# Patient Record
Sex: Male | Born: 1995 | Race: White | Hispanic: No | Marital: Single | Smoking: Never smoker
Health system: Southern US, Community
[De-identification: ages and names within clinical notes are randomized; demographics above are authoritative.]

## PROBLEM LIST (undated history)

## (undated) DIAGNOSIS — J45909 Unspecified asthma, uncomplicated: Secondary | ICD-10-CM

## (undated) HISTORY — PX: CHOLECYSTECTOMY: SHX55

---
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
## 2016-10-28 IMAGING — CR DG LUMBAR SPINE COMPLETE 4+V
5 series · 5 of 5 positions shown · non-contrast
Comparison: None.

CLINICAL DATA: A solid yesterday.  Back pain.

EXAM:
LUMBAR SPINE - COMPLETE 4+ VIEW

[t l-spine a.p.]
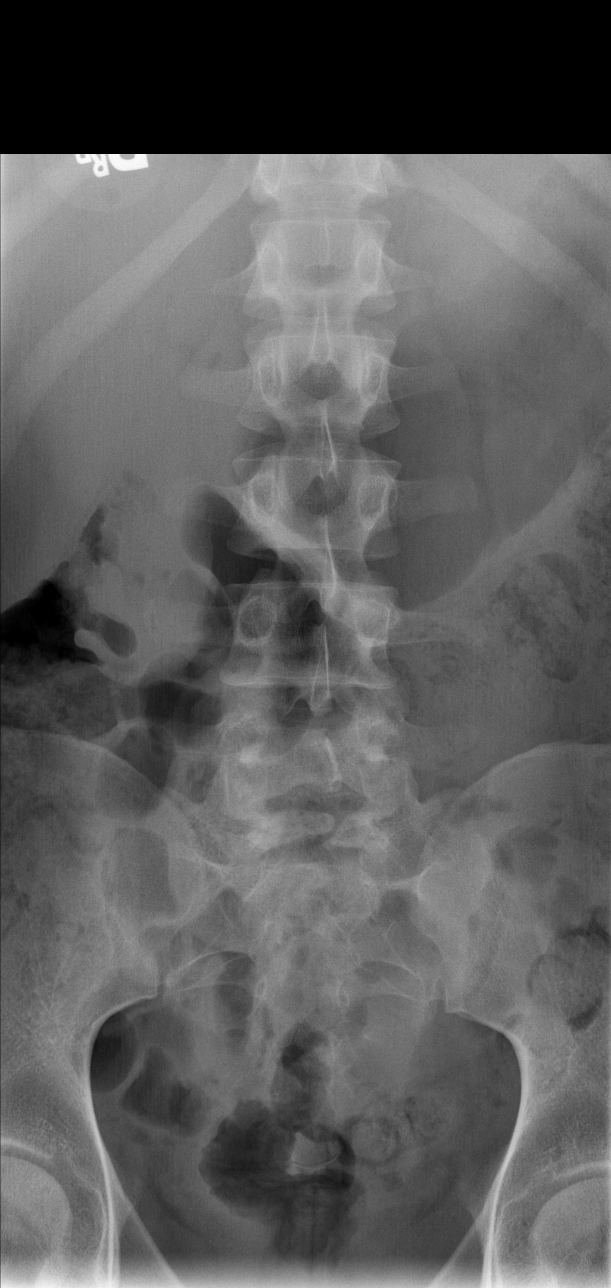

[t l-spine oblique exposure (1 of 2)]
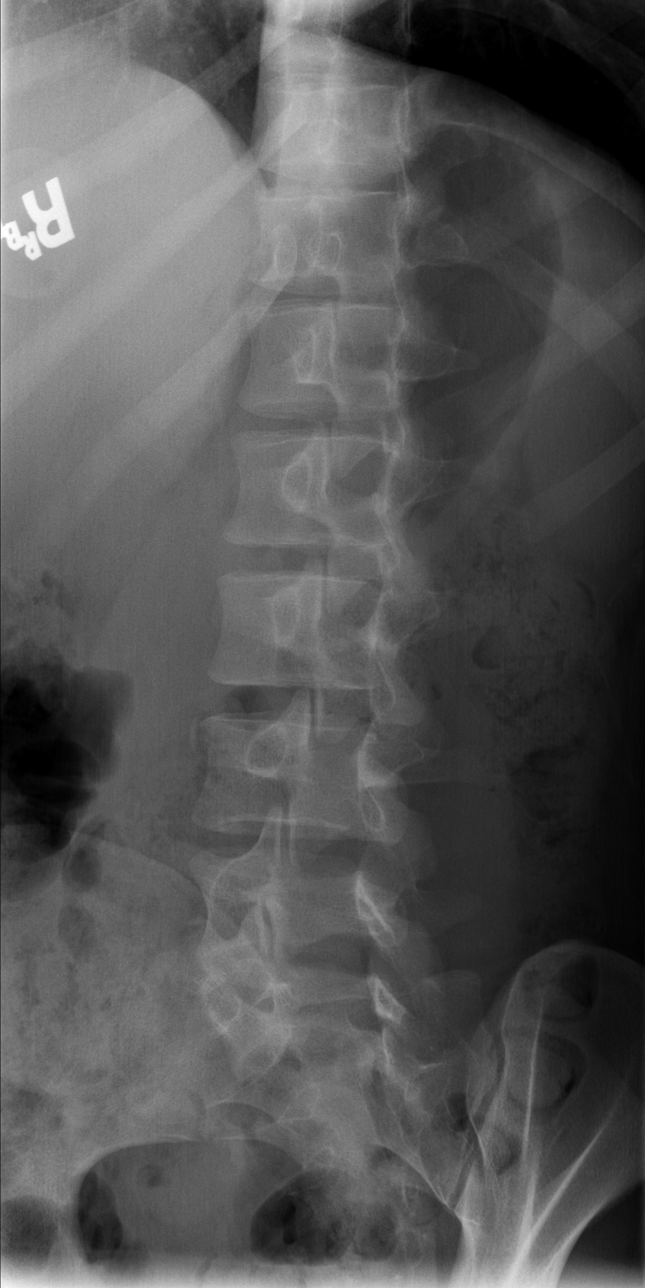

[t l-spine oblique exposure (2 of 2)]
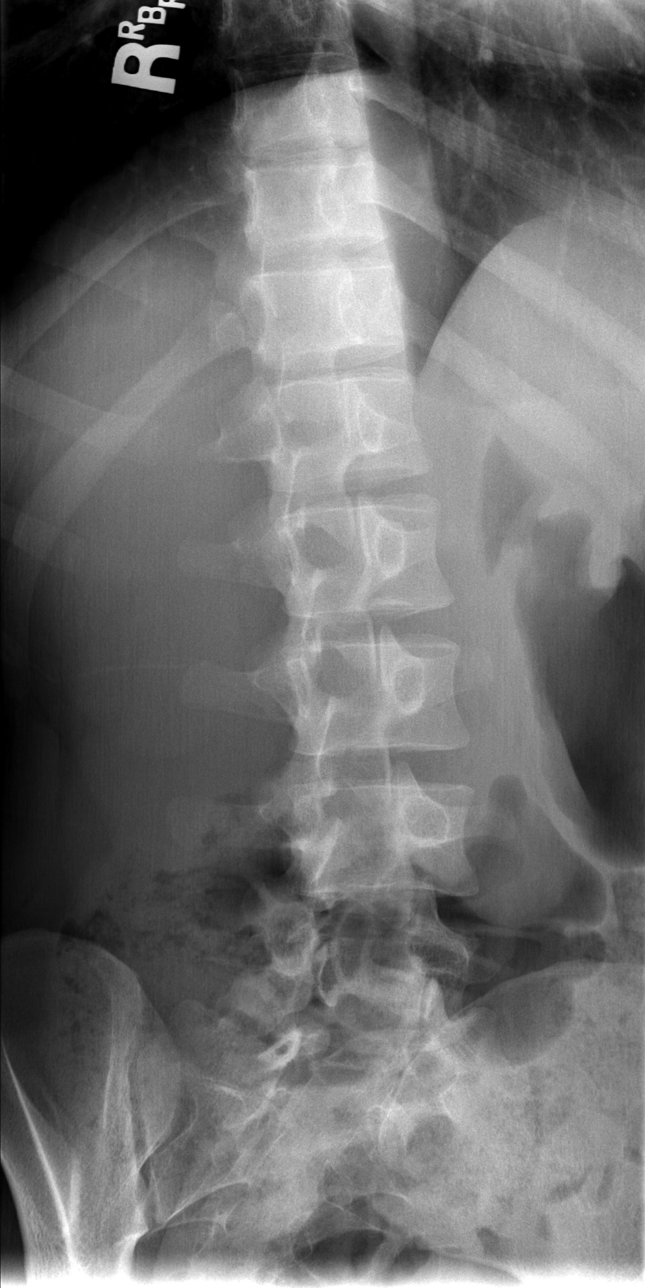

[t l-spine lat]
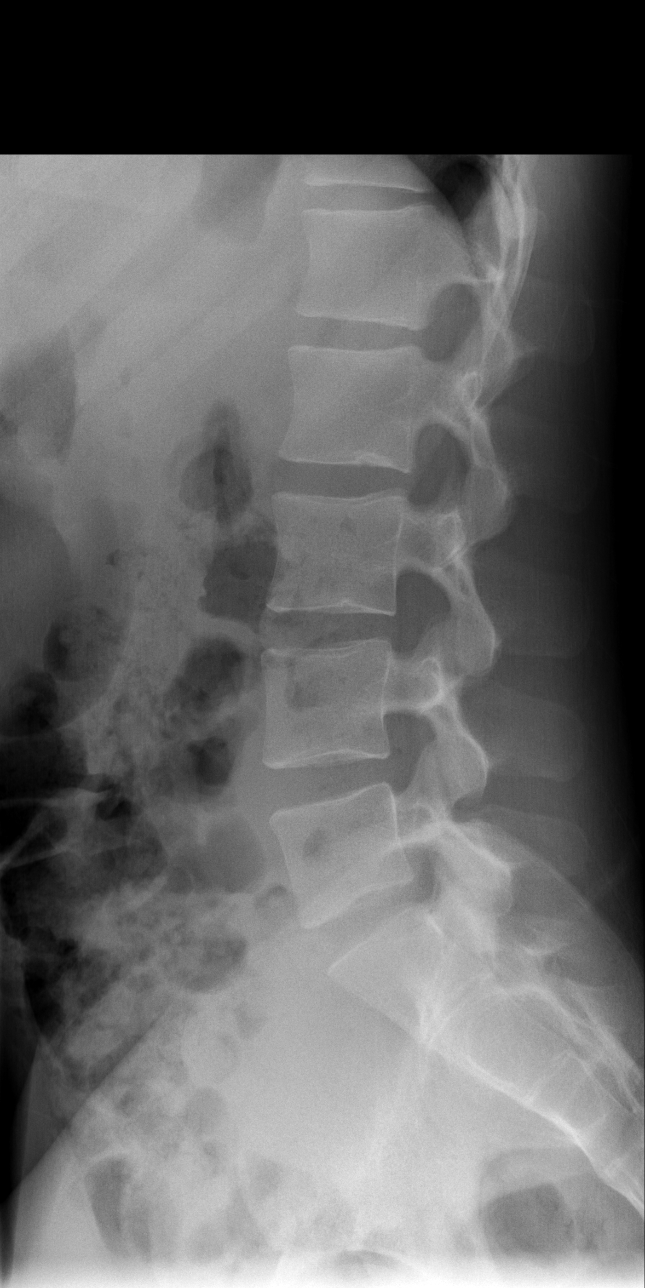

[t l-spine l5-s1 spot]
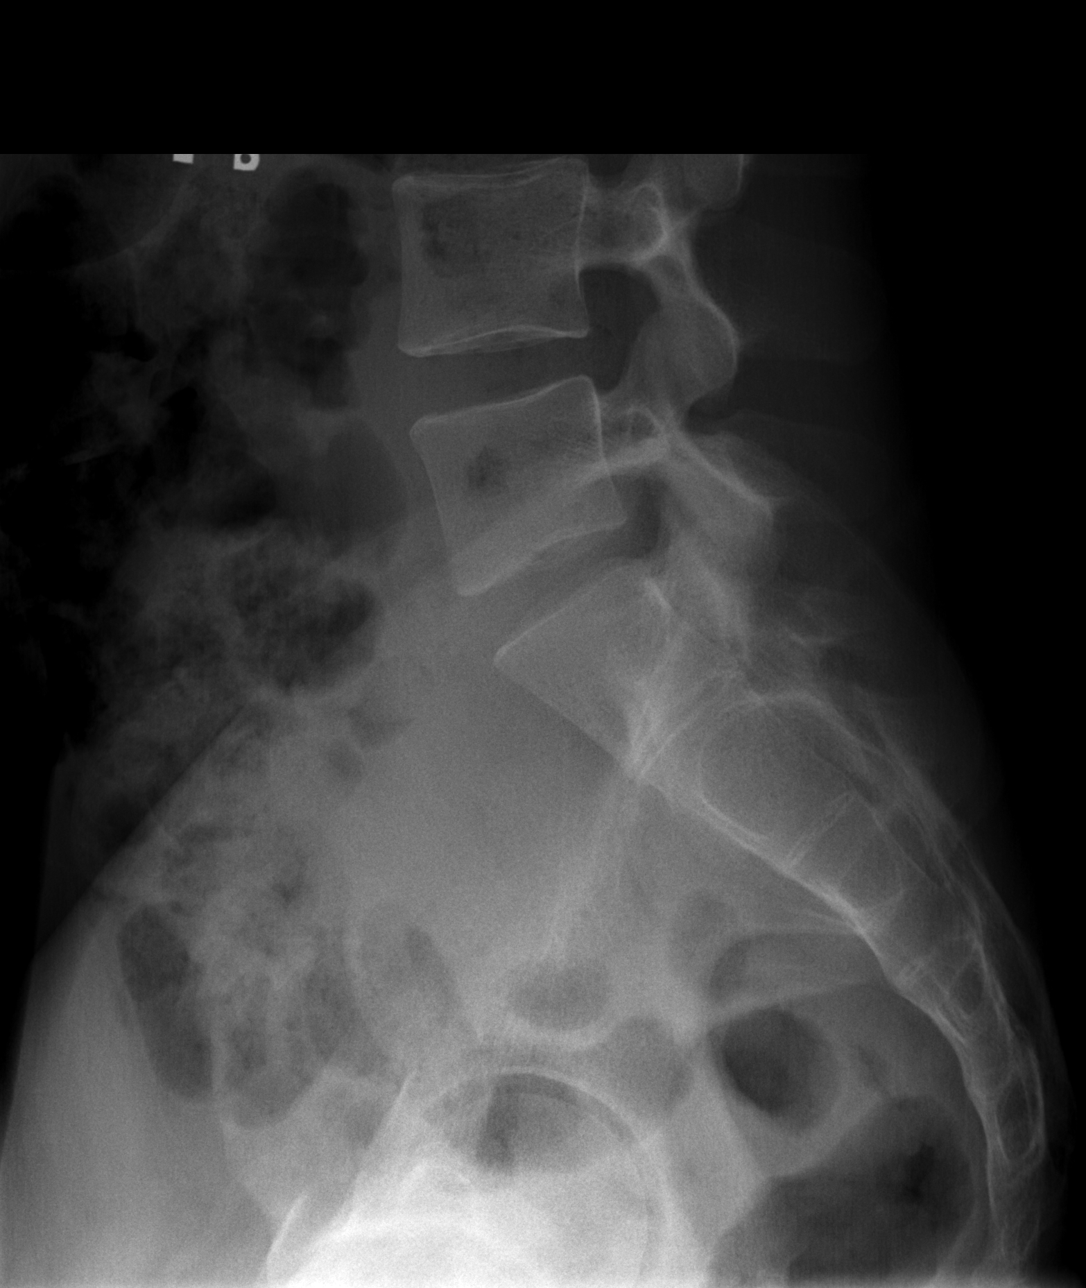

[5 of 5 positions shown; findings below may reference images not displayed]

FINDINGS: Five non rib-bearing lumbar type vertebral bodies are present.
Vertebral body heights alignment are maintained. There is incomplete
closure of the posterior elements at S1, essentially normal variant.

Vertebral body heights alignment are maintained. No acute bone or
soft tissue abnormality is present.
IMPRESSION: Negative lumbar spine radiographs.

## 2022-06-22 ENCOUNTER — Encounter (HOSPITAL_BASED_OUTPATIENT_CLINIC_OR_DEPARTMENT_OTHER): Payer: Self-pay | Admitting: Emergency Medicine

## 2022-06-22 ENCOUNTER — Emergency Department (HOSPITAL_BASED_OUTPATIENT_CLINIC_OR_DEPARTMENT_OTHER): Payer: Self-pay

## 2022-06-22 ENCOUNTER — Emergency Department (HOSPITAL_BASED_OUTPATIENT_CLINIC_OR_DEPARTMENT_OTHER)
Admission: EM | Admit: 2022-06-22 | Discharge: 2022-06-22 | Disposition: A | Discharge status: Home / Self Care | Payer: Self-pay | Attending: Emergency Medicine | Admitting: Emergency Medicine

## 2022-06-22 ENCOUNTER — Other Ambulatory Visit (HOSPITAL_BASED_OUTPATIENT_CLINIC_OR_DEPARTMENT_OTHER): Payer: Self-pay

## 2022-06-22 ENCOUNTER — Other Ambulatory Visit: Payer: Self-pay

## 2022-06-22 DIAGNOSIS — J45909 Unspecified asthma, uncomplicated: Secondary | ICD-10-CM | POA: Insufficient documentation

## 2022-06-22 DIAGNOSIS — D72829 Elevated white blood cell count, unspecified: Secondary | ICD-10-CM | POA: Insufficient documentation

## 2022-06-22 DIAGNOSIS — Z1152 Encounter for screening for COVID-19: Secondary | ICD-10-CM | POA: Insufficient documentation

## 2022-06-22 DIAGNOSIS — J029 Acute pharyngitis, unspecified: Secondary | ICD-10-CM | POA: Insufficient documentation

## 2022-06-22 LAB — BASIC METABOLIC PANEL
Anion gap: 8 (ref 5–15)
BUN: 7 mg/dL (ref 6–20)
CO2: 25 mmol/L (ref 22–32)
Calcium: 9.1 mg/dL (ref 8.9–10.3)
Chloride: 104 mmol/L (ref 98–111)
Creatinine, Ser: 0.93 mg/dL (ref 0.61–1.24)
GFR, Estimated: >60 mL/min (ref >60)
Glucose, Bld: 104 mg/dL — ABNORMAL HIGH (ref 70–99)
Potassium: 3.7 mmol/L (ref 3.5–5.1)
Sodium: 137 mmol/L (ref 135–145)

## 2022-06-22 LAB — CBC WITH DIFFERENTIAL/PLATELET
Abs Immature Granulocytes: 0.04 10*3/uL (ref 0.00–0.07)
Basophils Absolute: 0 10*3/uL (ref 0.0–0.1)
Basophils Relative: 0 %
Eosinophils Absolute: 0.5 10*3/uL (ref 0.0–0.5)
Eosinophils Relative: 5 %
HCT: 42.7 % (ref 39.0–52.0)
Hemoglobin: 14.2 g/dL (ref 13.0–17.0)
Immature Granulocytes: 0 %
Lymphocytes Relative: 15 %
Lymphs Abs: 1.6 10*3/uL (ref 0.7–4.0)
MCH: 27.7 pg (ref 26.0–34.0)
MCHC: 33.3 g/dL (ref 30.0–36.0)
MCV: 83.4 fL (ref 80.0–100.0)
Monocytes Absolute: 0.8 10*3/uL (ref 0.1–1.0)
Monocytes Relative: 7 %
Neutro Abs: 7.7 10*3/uL (ref 1.7–7.7)
Neutrophils Relative %: 73 %
Platelets: 301 10*3/uL (ref 150–400)
RBC: 5.12 MIL/uL (ref 4.22–5.81)
RDW: 13 % (ref 11.5–15.5)
WBC: 10.7 10*3/uL — ABNORMAL HIGH (ref 4.0–10.5)
nRBC: 0 % (ref 0.0–0.2)

## 2022-06-22 LAB — RESP PANEL BY RT-PCR (RSV, FLU A&B, COVID)  RVPGX2
Influenza A by PCR: NEGATIVE
Influenza B by PCR: NEGATIVE
Resp Syncytial Virus by PCR: NEGATIVE
SARS Coronavirus 2 by RT PCR: NEGATIVE

## 2022-06-22 LAB — GROUP A STREP BY PCR: Group A Strep by PCR: NOT DETECTED

## 2022-06-22 MED ORDER — DEXAMETHASONE SODIUM PHOSPHATE 10 MG/ML IJ SOLN
10.0000 mg | Freq: Once | INTRAMUSCULAR | Status: AC
  Administered 2022-06-22: 10 mg
  Filled 2022-06-22: qty 1

## 2022-06-22 MED ORDER — IOHEXOL 300 MG/ML  SOLN
100.0000 mL | Freq: Once | INTRAMUSCULAR | Status: AC | PRN
  Administered 2022-06-22: 75 mL via INTRAVENOUS

## 2022-06-22 MED ORDER — PREDNISONE 50 MG PO TABS: ORAL_TABLET | ORAL | 0 refills | Status: AC

## 2022-06-22 MED ORDER — SODIUM CHLORIDE 0.9 % IV BOLUS
1000.0000 mL | Freq: Once | INTRAVENOUS | Status: AC
  Administered 2022-06-22: 1000 mL via INTRAVENOUS

## 2022-06-22 MED ORDER — KETOROLAC TROMETHAMINE 15 MG/ML IJ SOLN
15.0000 mg | Freq: Once | INTRAMUSCULAR | Status: AC
  Administered 2022-06-22: 15 mg via INTRAVENOUS
  Filled 2022-06-22: qty 1

## 2022-06-22 NOTE — ED Provider Notes (Signed)
[place] EMERGENCY DEPARTMENT AT [facility] Provider Note  CSN: [id number] Arrival date & time: 06/22/22 0304  Chief Complaint(s) Sore Throat  HPI Micheal Porter is a 27 y.o. male with past medical history as below, significant for asthma, cholecystectomy who presents to the ED with complaint of sore throat.  He reports pain to his throat over the past 3 days.  No significant relief with OTC analgesics and OTC cough medications.  Having difficulty swallowing and speaking secondary to discomfort to his throat.  Feels like his throat is swollen and has been worsening.  No fevers or chills over last 24 hours.  No nausea or vomiting.  No recent dental procedures.  Having difficulty swallowing his own secretions over last few hours leading up to visit to ED.   Past Medical History Past Medical History:  Diagnosis Date   Asthma    Patient Active Problem List   Diagnosis Date Noted   Left knee injury 01/24/2014   Home Medication(s) Prior to Admission medications   Medication Sig Start Date End Date Taking? Authorizing Provider  predniSONE (DELTASONE) 50 MG tablet 1 p.o. daily x 4 06/22/22  Yes Lorre Nick, MD  albuterol (PROVENTIL HFA;VENTOLIN HFA) 108 (90 Base) MCG/ACT inhaler Inhale 1-2 puffs into the lungs every 6 (six) hours as needed for wheezing or shortness of breath.    [provider]  Cetirizine HCl (ZYRTEC PO) Take by mouth.    [provider]  ibuprofen (ADVIL,MOTRIN) 800 MG tablet Take 1 tablet (800 mg total) by mouth every 8 (eight) hours as needed. 05/30/15   Selina Cooley, MD  permethrin (ELIMITE) 5 % cream Apply thin layer from neck to soles of feet and every crevice in between. Leave on 8-14 hours then wash off in bath or shower. May repeat in 1 week if needed/not improving 11/29/15   Mesner, Barbara Cower, MD                                                                                                                                    Past Surgical  History Past Surgical History:  Procedure Laterality Date   CHOLECYSTECTOMY     Family History History reviewed. No pertinent family history.  Social History Social History   Tobacco Use   Smoking status: Never   Smokeless tobacco: Never  Substance Use Topics   Alcohol use: No    Alcohol/week: 0.0 standard drinks of alcohol   Drug use: No   Allergies Zithromax [azithromycin]  Review of Systems Review of Systems  Constitutional:  Negative for chills and fever.  HENT:  Positive for sore throat, trouble swallowing and voice change. Negative for facial swelling.   Eyes:  Negative for photophobia and visual disturbance.  Respiratory:  Negative for cough and shortness of breath.   Cardiovascular:  Negative for chest pain and palpitations.  Gastrointestinal:  Negative for abdominal pain, nausea and vomiting.  Endocrine: Negative  for polydipsia and polyuria.  Genitourinary:  Negative for difficulty urinating and hematuria.  Musculoskeletal:  Negative for gait problem and joint swelling.  Skin:  Negative for pallor and rash.  Neurological:  Negative for syncope and headaches.  Psychiatric/Behavioral:  Negative for agitation and confusion.     Physical Exam Vital Signs  I have reviewed the triage vital signs BP 130/80   Pulse 93   Temp 98 F (36.7 C) (Oral)   Resp 18   SpO2 98%  Physical Exam Vitals and nursing note reviewed.  Constitutional:      General: He is not in acute distress.    Appearance: Normal appearance. He is well-developed. He is not ill-appearing.  HENT:     Head: Normocephalic and atraumatic. No right periorbital erythema or left periorbital erythema.     Jaw: There is normal jaw occlusion. No trismus.     Right Ear: External ear normal.     Left Ear: External ear normal.     Mouth/Throat:     Mouth: Mucous membranes are moist.     Dentition: Normal dentition.     Pharynx: Uvula midline. Pharyngeal swelling and posterior oropharyngeal erythema  present.     Comments: Tonsils edematous  No drooling, stridor or trismus Dysphonia noted Infra lingual space is soft Eyes:     General: No scleral icterus.    Extraocular Movements: Extraocular movements intact.  Cardiovascular:     Rate and Rhythm: Normal rate and regular rhythm.     Pulses: Normal pulses.     Heart sounds: Normal heart sounds.  Pulmonary:     Effort: Pulmonary effort is normal. No respiratory distress.     Breath sounds: Normal breath sounds.  Abdominal:     General: Abdomen is flat.     Palpations: Abdomen is soft.     Tenderness: There is no abdominal tenderness.  Musculoskeletal:        General: Normal range of motion.     Cervical back: No rigidity.     Right lower leg: No edema.     Left lower leg: No edema.  Skin:    General: Skin is warm and dry.     Capillary Refill: Capillary refill takes less than 2 seconds.  Neurological:     Mental Status: He is alert and oriented to person, place, and time.     GCS: GCS eye subscore is 4. GCS verbal subscore is 5. GCS motor subscore is 6.  Psychiatric:        Mood and Affect: Mood normal.        Behavior: Behavior normal.     ED Results and Treatments Labs (all labs ordered are listed, but only abnormal results are displayed) Labs Reviewed  CBC WITH DIFFERENTIAL/PLATELET - Abnormal; Notable for the following components:      Result Value   WBC 10.7 (*)    All other components within normal limits  BASIC METABOLIC PANEL - Abnormal; Notable for the following components:   Glucose, Bld 104 (*)    All other components within normal limits  GROUP A STREP BY PCR  RESP PANEL BY RT-PCR (RSV, FLU A&B, COVID)  RVPGX2  Radiology CT Soft Tissue Neck W Contrast  Result Date: 06/22/2022 CLINICAL DATA:  27 year old male with sore throat for 3 days, feels like throat is closing now. EXAM: CT  NECK WITH CONTRAST TECHNIQUE: Multidetector CT imaging of the neck was performed using the standard protocol following the bolus administration of intravenous contrast. RADIATION DOSE REDUCTION: This exam was performed according to the departmental dose-optimization program which includes automated exposure control, adjustment of the mA and/or kV according to patient size and/or use of iterative reconstruction technique. CONTRAST:  75mL OMNIPAQUE IOHEXOL 300 MG/ML  SOLN COMPARISON:  None Available. FINDINGS: Pharynx and larynx: Gas distended hypopharynx. Mild generalized laryngeal mucosal space soft tissue thickening, including mild thickening of the epiglottis (series 2, image 50 and sagittal image 52) such as with laryngitis. No evidence of acute epiglottitis. Moderate to severe bilateral tonsillar hypertrophy, with moderate tonsillar hyperenhancement. The lingual tonsil is least affected. The adenoids are affected. And there is also soft tissue swelling of the soft palate and uvula (series 2, image 35). Retained secretions in the nasopharynx and nasal cavity. But no tonsillar suppuration. Parapharyngeal spaces remain normal. Mild retropharyngeal space edema. No retropharyngeal fluid collection. Salivary glands: Negative submandibular glands, submandibular spaces. Negative sublingual space. Granular heterogeneity of the parotid glands, appears to be chronic. No sialolithiasis or parotid duct enlargement. Thyroid: Negative. Lymph nodes: Bilateral level 1 and level 2 reactive appearing cervical lymphadenopathy. Individual enlarged solid nodes up to 17-18 mm short axis. Level 3 nodes less affected bilaterally. No cystic or necrotic nodes. Levels 4 and 5 relatively spared. Vascular: Major vascular structures in the neck and at the skull base remain patent including both internal jugular veins. Limited intracranial: Negative. Visualized orbits: Negative. Mastoids and visualized paranasal sinuses: Mild to moderate  bilateral maxillary and ethmoid sinus mucosal thickening. Nasal cavity mucosal thickening and retained secretions. Tympanic cavities and mastoids match that tympanic cavities and visible mastoids are clear. Skeleton: No acute dental finding. No osseous abnormality identified. Upper chest: Normal subglottic trachea. Upper lungs and visible major airways are clear. Normal visible superior mediastinum. IMPRESSION: Evidence of combined Acute Tonsillitis and Laryngitis/pharyngitis, including mild soft tissue swelling of the soft palate and uvula. But no evidence of acute epiglottitis. Superimposed Rhinosinusitis.  And Reactive cervical lymphadenopathy. But no abscess or other complicating features at this time. Electronically Signed   By: Odessa Fleming M.D.   On: 06/22/2022 07:43    Pertinent labs & imaging results that were available during my care of the patient were reviewed by me and considered in my medical decision making (see MDM for details).  Medications Ordered in ED Medications  dexamethasone (DECADRON) injection 10 mg (10 mg Other Given 06/22/22 0643)  ketorolac (TORADOL) 15 MG/ML injection 15 mg (15 mg Intravenous Given 06/22/22 0642)  sodium chloride 0.9 % bolus 1,000 mL (1,000 mLs Intravenous New Bag/Given 06/22/22 0639)  iohexol (OMNIPAQUE) 300 MG/ML solution 100 mL (75 mLs Intravenous Contrast Given 06/22/22 0721)  Procedures Procedures  (including critical care time)  Medical Decision Making / ED Course    Medical Decision Making:    Khabir Schettini Karrick is a 27 y.o. male  with past medical history as below, significant for asthma who presents to the ED with complaint of sore throat. . The complaint involves an extensive differential diagnosis and also carries with it a high risk of complications and morbidity.  Serious etiology was considered. Ddx includes but is not  limited to: Tonsillitis, pharyngitis, PTA, epiglottitis, Ludwig angina etc.  Complete initial physical exam performed, notably the patient  was voice is hoarse, no drooling stridor or trismus.  No hypoxia..    Reviewed and confirmed nursing documentation for past medical history, family history, social history.  Vital signs reviewed.    Clinical Course as of 06/23/22 0007  Sat Jun 22, 2022  0721 WBC(!): 10.7 Not septic  [SG]    Clinical Course User Index [SG] Sloan Leiter, DO   He has sig tonsillar edema on exam, no drooling/stridor or trismus. RVP/strep was neg. He is able to tolerate his own secretions on exam. Labs stable. Given decadron, he is pending CT neck at time of shift change. Signed out to incoming edp pending imaging and recheck.           Additional history obtained: -Additional history obtained from spouse -External records from outside source obtained and reviewed including: Chart review including previous notes, labs, imaging, consultation notes including prior ED visits, medications, prior labs and imaging   Lab Tests: -I ordered, reviewed, and interpreted labs.   The pertinent results include:   Labs Reviewed  CBC WITH DIFFERENTIAL/PLATELET - Abnormal; Notable for the following components:      Result Value   WBC 10.7 (*)    All other components within normal limits  BASIC METABOLIC PANEL - Abnormal; Notable for the following components:   Glucose, Bld 104 (*)    All other components within normal limits  GROUP A STREP BY PCR  RESP PANEL BY RT-PCR (RSV, FLU A&B, COVID)  RVPGX2    Notable for slight leukocytosis, strep and flu and COVID-negative  EKG   EKG Interpretation  Date/Time:    Ventricular Rate:    PR Interval:    QRS Duration:   QT Interval:    QTC Calculation:   R Axis:     Text Interpretation:           Imaging Studies ordered: I ordered imaging studies including CT soft tissue neck I independently visualized the following  imaging with scope of interpretation limited to determining acute life threatening conditions related to emergency care; findings noted above, significant for PENDING I independently visualized and interpreted imaging. I agree with the radiologist interpretation   Medicines ordered and prescription drug management: Meds ordered this encounter  Medications   dexamethasone (DECADRON) injection 10 mg   ketorolac (TORADOL) 15 MG/ML injection 15 mg   sodium chloride 0.9 % bolus 1,000 mL   iohexol (OMNIPAQUE) 300 MG/ML solution 100 mL   predniSONE (DELTASONE) 50 MG tablet    Sig: 1 p.o. daily x 4    Dispense:  4 tablet    Refill:  0    -I have reviewed the patients home medicines and have made adjustments as needed   Consultations Obtained: na   Cardiac Monitoring: The patient was maintained on a cardiac monitor.  I personally viewed and interpreted the cardiac monitored which showed an underlying rhythm of: NSR  Social  Determinants of Health:  Diagnosis or treatment significantly limited by social determinants of health: non smoker   Reevaluation: After the interventions noted above, I reevaluated the patient and found that they have improved  Co morbidities that complicate the patient evaluation  Past Medical History:  Diagnosis Date   Asthma       Dispostion: Disposition decision including need for hospitalization was considered, and patient disposition pending at time of sign out.    Final Clinical Impression(s) / ED Diagnoses Final diagnoses:  Pharyngitis, unspecified etiology     This chart was dictated using voice recognition software.  Despite best efforts to proofread,  errors can occur which can change the documentation meaning.    Sloan Leiter, DO 06/23/22 0007

## 2022-06-22 NOTE — ED Triage Notes (Addendum)
Pt presents to ED POV. Pt c/o sore throat x3d. Pt reports that he began to feel like his throat was closing tonight. Pt report taking cold and allergy meds that helped for a few days but no relief now. Voice muffled in triage

## 2022-06-22 NOTE — ED Notes (Signed)
Dc instructions reviewed with patient. Patient voiced understanding. Dc with belongings.  °

## 2022-06-22 NOTE — ED Provider Notes (Signed)
Patient seen by prior provider, Dr. Wallace Cullens.  Signed out patient's CT of his soft tissue neck which, negative for abscess.  Patient has had good response to steroids given by Dr. Wallace Cullens.  Will discharge home on short course of prednisone.  Return precautions given   Lorre Nick, MD 06/22/22 [phone]

## 2022-06-22 NOTE — ED Notes (Signed)
Transported to CT 

## 2023-01-28 ENCOUNTER — Emergency Department (HOSPITAL_COMMUNITY)
Admission: EM | Admit: 2023-01-28 | Discharge: 2023-01-29 | Disposition: A | Discharge status: Home / Self Care | Payer: Self-pay | Attending: Emergency Medicine | Admitting: Emergency Medicine

## 2023-01-28 ENCOUNTER — Encounter (HOSPITAL_COMMUNITY): Payer: Self-pay

## 2023-01-28 DIAGNOSIS — T2642XA Burn of left eye and adnexa, part unspecified, initial encounter: Secondary | ICD-10-CM

## 2023-01-28 DIAGNOSIS — X19XXXA Contact with other heat and hot substances, initial encounter: Secondary | ICD-10-CM | POA: Insufficient documentation

## 2023-01-28 DIAGNOSIS — T2612XA Burn of cornea and conjunctival sac, left eye, initial encounter: Secondary | ICD-10-CM | POA: Insufficient documentation

## 2023-01-28 DIAGNOSIS — H16002 Unspecified corneal ulcer, left eye: Secondary | ICD-10-CM | POA: Insufficient documentation

## 2023-01-28 DIAGNOSIS — Y9389 Activity, other specified: Secondary | ICD-10-CM | POA: Insufficient documentation

## 2023-01-28 MED ORDER — SODIUM CHLORIDE 0.9 % IV BOLUS: 1000.0000 mL | Freq: Once | INTRAVENOUS | Status: DC

## 2023-01-28 MED ORDER — TETRACAINE HCL 0.5 % OP SOLN
2.0000 [drp] | Freq: Once | OPHTHALMIC | Status: AC
  Administered 2023-01-28: 2 [drp] via OPHTHALMIC
  Filled 2023-01-28: qty 4

## 2023-01-28 MED ORDER — FLUORESCEIN SODIUM 1 MG OP STRP
1.0000 | ORAL_STRIP | Freq: Once | OPHTHALMIC | Status: AC
  Administered 2023-01-28: 1 via OPHTHALMIC
  Filled 2023-01-28: qty 1

## 2023-01-28 MED ORDER — ERYTHROMYCIN 5 MG/GM OP OINT
1.0000 | TOPICAL_OINTMENT | OPHTHALMIC | Status: AC
  Administered 2023-01-28: 1 via OPHTHALMIC
  Filled 2023-01-28: qty 3.5

## 2023-01-28 MED ORDER — OXYCODONE-ACETAMINOPHEN 5-325 MG PO TABS
1.0000 | ORAL_TABLET | Freq: Once | ORAL | Status: AC
  Administered 2023-01-28: 1 via ORAL
  Filled 2023-01-28: qty 1
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
## 2023-01-28 NOTE — ED Provider Notes (Signed)
[place] [facility]GENC[facility] DEPARTMENT AT [facility] Provider Note   CSN: [id number] Arrival date & time: 01/28/23  1547     History {Add pertinent medical, surgical, social history, OB history to HPI:1} Chief Complaint  Patient presents with   Chemical Exposure    Micheal Porter is a 28 y.o. male.  28 year old male previously healthy presents to the emergency department with a chemical burn of his eye.  Patient reports that he had hot degreaser splashed into his left eye 24 hours prior to arrival.  Says that he has decreased visual acuity and pain out of that eye.  Has a patch over his eye.  Says it is the purple power cleaner but does not recall exactly which 1.  Washed his eye with water 20 minutes after it occurred.  Does not wear contacts.       Home Medications Prior to Admission medications   Medication Sig Start Date End Date Taking? Authorizing Provider  albuterol (PROVENTIL HFA;VENTOLIN HFA) 108 (90 Base) MCG/ACT inhaler Inhale 1-2 puffs into the lungs every 6 (six) hours as needed for wheezing or shortness of breath.    [provider]  Cetirizine HCl (ZYRTEC PO) Take by mouth.    [provider]  ibuprofen (ADVIL,MOTRIN) 800 MG tablet Take 1 tablet (800 mg total) by mouth every 8 (eight) hours as needed. 05/30/15   Selina Cooley, MD  permethrin (ELIMITE) 5 % cream Apply thin layer from neck to soles of feet and every crevice in between. Leave on 8-14 hours then wash off in bath or shower. May repeat in 1 week if needed/not improving 11/29/15   Mesner, Barbara Cower, MD  predniSONE (DELTASONE) 50 MG tablet 1 p.o. daily x 4 06/22/22   Lorre Nick, MD      Allergies    Zithromax [azithromycin]    Review of Systems   Review of Systems  Physical Exam Updated Vital Signs BP (!) 115/90 (BP Location: Right Arm)   Pulse (!) 127   Temp 98.1 F (36.7 C)   Resp 18   SpO2 96%  Physical Exam HENT:     Head:     Comments: See image below Eyes:      Comments: Pupils 3 mm bilaterally.  Fluorescein stain with significant amount of uptake in the inferior temporal portion of the cornea.  Left eye visual acuity 20/200.  Right eye visual acuity 20/20.     ED Results / Procedures / Treatments   Labs (all labs ordered are listed, but only abnormal results are displayed) Labs Reviewed - No data to display  EKG None  Radiology No results found.  Procedures Procedures  {Document cardiac monitor, telemetry assessment procedure when appropriate:1}  Medications Ordered in ED Medications  tetracaine (PONTOCAINE) 0.5 % ophthalmic solution 2 drop (has no administration in time range)  fluorescein ophthalmic strip 1 strip (has no administration in time range)  oxyCODONE-acetaminophen (PERCOCET/ROXICET) 5-325 MG per tablet 1 tablet (1 tablet Oral Given 01/28/23 1828)    ED Course/ Medical Decision Making/ A&P Clinical Course as of 01/28/23 2357  Tue Jan 28, 2023  2227 Dr Dione Booze. Azithro. Can give him cell.  [RP]    Clinical Course User Index [RP] Rondel Baton, MD   {   Click here for ABCD2, HEART and other calculatorsREFRESH Note before signing :1}  Medical Decision Making Risk Prescription drug management.   ***  {Document critical care time when appropriate:1} {Document review of labs and clinical decision tools ie heart score, Chads2Vasc2 etc:1}  {Document your independent review of radiology images, and any outside records:1} {Document your discussion with family members, caretakers, and with consultants:1} {Document social determinants of health affecting pt's care:1} {Document your decision making why or why not admission, treatments were needed:1} Final Clinical Impression(s) / ED Diagnoses Final diagnoses:  None    Rx / DC Orders ED Discharge Orders     None

## 2023-01-28 NOTE — ED Triage Notes (Signed)
Pt is coming in for an exposure of a Chemical in his eye. States it occurred lastnight around 2030 and the chemical was a degreaser in a purple bottle. States it possibly is purple power cleaner that got in his eye. The affected eye is the left eye, he comes in with an eye cover and has not been seen for it yet. He did was it at home for around total with water.

## 2023-01-29 NOTE — ED Provider Notes (Signed)
Patient signed out to me by Dr. Eloise Harman to follow-up treatment for caustic eye injury.  Patient currently receiving a second liter of saline irrigation via Morgan's lens.  After this additional treatment, recheck of his pH is 7.0.  Will discharge with antibiotic ointment, follow-up with Dr. Dione Booze.   Gilda Crease, MD 01/29/23 [phone]
